# Patient Record
Sex: Female | Born: 1950 | Hispanic: Yes | Marital: Married | Smoking: Never smoker
Health system: Southern US, Community
[De-identification: ages and names within clinical notes are randomized; demographics above are authoritative.]

## PROBLEM LIST (undated history)

## (undated) HISTORY — PX: OTHER SURGICAL HISTORY: SHX169

## (undated) HISTORY — PX: CHOLECYSTECTOMY: SHX55

---

## 2015-08-30 ENCOUNTER — Encounter (HOSPITAL_BASED_OUTPATIENT_CLINIC_OR_DEPARTMENT_OTHER): Payer: Self-pay | Admitting: Emergency Medicine

## 2015-08-30 ENCOUNTER — Emergency Department (HOSPITAL_BASED_OUTPATIENT_CLINIC_OR_DEPARTMENT_OTHER): Payer: Self-pay

## 2015-08-30 ENCOUNTER — Emergency Department (HOSPITAL_BASED_OUTPATIENT_CLINIC_OR_DEPARTMENT_OTHER)
Admission: EM | Admit: 2015-08-30 | Discharge: 2015-08-30 | Disposition: A | Discharge status: Other Acute Inpatient Hospital | Payer: Self-pay | Attending: Emergency Medicine | Admitting: Emergency Medicine

## 2015-08-30 DIAGNOSIS — K859 Acute pancreatitis without necrosis or infection, unspecified: Secondary | ICD-10-CM | POA: Insufficient documentation

## 2015-08-30 DIAGNOSIS — R1012 Left upper quadrant pain: Secondary | ICD-10-CM

## 2015-08-30 LAB — CBC WITH DIFFERENTIAL/PLATELET
BASOS PCT: 1 %
Basophils Absolute: 0.1 10*3/uL (ref 0.0–0.1)
EOS ABS: 0 10*3/uL (ref 0.0–0.7)
EOS PCT: 0 %
HCT: 42.3 % (ref 36.0–46.0)
HEMOGLOBIN: 14.3 g/dL (ref 12.0–15.0)
LYMPHS ABS: 1.6 10*3/uL (ref 0.7–4.0)
Lymphocytes Relative: 15 %
MCH: 32.3 pg (ref 26.0–34.0)
MCHC: 33.8 g/dL (ref 30.0–36.0)
MCV: 95.5 fL (ref 78.0–100.0)
MONO ABS: 0.3 10*3/uL (ref 0.1–1.0)
MONOS PCT: 3 %
NEUTROS PCT: 81 %
Neutro Abs: 8.5 10*3/uL — ABNORMAL HIGH (ref 1.7–7.7)
Platelets: 250 10*3/uL (ref 150–400)
RBC: 4.43 MIL/uL (ref 3.87–5.11)
RDW: 12.1 % (ref 11.5–15.5)
WBC: 10.5 10*3/uL (ref 4.0–10.5)

## 2015-08-30 LAB — URINALYSIS, ROUTINE W REFLEX MICROSCOPIC
BILIRUBIN URINE: NEGATIVE
Glucose, UA: 100 mg/dL — AB
HGB URINE DIPSTICK: NEGATIVE
KETONES UR: NEGATIVE mg/dL
Leukocytes, UA: NEGATIVE
NITRITE: NEGATIVE
Protein, ur: NEGATIVE mg/dL
SPECIFIC GRAVITY, URINE: 1.045 — AB (ref 1.005–1.030)
pH: 7.5 (ref 5.0–8.0)

## 2015-08-30 LAB — COMPREHENSIVE METABOLIC PANEL
ALBUMIN: 4.5 g/dL (ref 3.5–5.0)
ALK PHOS: 77 U/L (ref 38–126)
ALT: 27 U/L (ref 14–54)
ANION GAP: 9 (ref 5–15)
AST: 34 U/L (ref 15–41)
BILIRUBIN TOTAL: 0.5 mg/dL (ref 0.3–1.2)
BUN: 21 mg/dL — AB (ref 6–20)
CALCIUM: 9.5 mg/dL (ref 8.9–10.3)
CO2: 26 mmol/L (ref 22–32)
CREATININE: 0.86 mg/dL (ref 0.44–1.00)
Chloride: 104 mmol/L (ref 101–111)
GFR calc Af Amer: >60 mL/min (ref >60)
GFR calc non Af Amer: >60 mL/min (ref >60)
GLUCOSE: 192 mg/dL — AB (ref 65–99)
Potassium: 4.1 mmol/L (ref 3.5–5.1)
SODIUM: 139 mmol/L (ref 135–145)
TOTAL PROTEIN: 7.7 g/dL (ref 6.5–8.1)

## 2015-08-30 LAB — LIPASE, BLOOD: Lipase: 68 U/L — ABNORMAL HIGH (ref 11–51)

## 2015-08-30 LAB — I-STAT CG4 LACTIC ACID, ED: LACTIC ACID, VENOUS: 2.02 mmol/L — AB (ref 0.5–2.0)

## 2015-08-30 MED ORDER — MORPHINE SULFATE (PF) 4 MG/ML IV SOLN
4.0000 mg | Freq: Once | INTRAVENOUS | Status: AC
  Administered 2015-08-30: 4 mg via INTRAVENOUS
  Filled 2015-08-30: qty 1

## 2015-08-30 MED ORDER — METOCLOPRAMIDE HCL 5 MG/ML IJ SOLN
10.0000 mg | Freq: Once | INTRAMUSCULAR | Status: AC
  Administered 2015-08-30: 10 mg via INTRAVENOUS
  Filled 2015-08-30: qty 2

## 2015-08-30 MED ORDER — ONDANSETRON HCL 4 MG/2ML IJ SOLN
4.0000 mg | Freq: Once | INTRAMUSCULAR | Status: AC
  Administered 2015-08-30: 4 mg via INTRAVENOUS
  Filled 2015-08-30: qty 2

## 2015-08-30 MED ORDER — SODIUM CHLORIDE 0.9 % IV SOLN
INTRAVENOUS | Status: DC
  Administered 2015-08-30: 07:00:00 via INTRAVENOUS

## 2015-08-30 MED ORDER — IOHEXOL 300 MG/ML  SOLN
100.0000 mL | Freq: Once | INTRAMUSCULAR | Status: AC | PRN
  Administered 2015-08-30: 100 mL via INTRAVENOUS

## 2015-08-30 MED ORDER — HYDROMORPHONE HCL 1 MG/ML IJ SOLN
1.0000 mg | Freq: Once | INTRAMUSCULAR | Status: AC
  Administered 2015-08-30: 1 mg via INTRAVENOUS
  Filled 2015-08-30: qty 1

## 2015-08-30 MED ORDER — FENTANYL CITRATE (PF) 100 MCG/2ML IJ SOLN
100.0000 ug | Freq: Once | INTRAMUSCULAR | Status: AC
  Administered 2015-08-30: 100 ug via INTRAVENOUS
  Filled 2015-08-30: qty 2

## 2015-08-30 MED ORDER — GI COCKTAIL ~~LOC~~
30.0000 mL | Freq: Once | ORAL | Status: AC
  Administered 2015-08-30: 30 mL via ORAL
  Filled 2015-08-30: qty 30

## 2015-08-30 MED ORDER — DIPHENHYDRAMINE HCL 50 MG/ML IJ SOLN
25.0000 mg | Freq: Once | INTRAMUSCULAR | Status: AC
  Administered 2015-08-30: 25 mg via INTRAVENOUS
  Filled 2015-08-30: qty 1

## 2015-08-30 MED ORDER — IOHEXOL 300 MG/ML  SOLN
25.0000 mL | Freq: Once | INTRAMUSCULAR | Status: AC | PRN
  Administered 2015-08-30: 25 mL via ORAL

## 2015-08-30 NOTE — ED Notes (Signed)
Report given to Ishmael HolterBrian RN on [facility]

## 2015-08-30 NOTE — ED Notes (Signed)
Warm pack given per request of pt by Heather.

## 2015-08-30 NOTE — ED Notes (Signed)
PT in CT. Daughter in room requested water for herself, given.

## 2015-08-30 NOTE — ED Notes (Signed)
[facility].

## 2015-08-30 NOTE — ED Notes (Signed)
Pt back from CT and ambulated to BR with daughter.

## 2015-08-30 NOTE — ED Provider Notes (Signed)
  Physical Exam  BP 153/79 mmHg  Pulse 78  Resp 16  Wt 163 lb (73.936 kg)  SpO2 98%  Physical Exam  Abdominal: There is no tenderness. There is no rebound and no guarding.    ED Course  Procedures  MDM Received patient in turnover from Dr. Rolley SimsMolphus, briefly patient awoke with sever LUQ abdominal pain with radiation to the back.  Concern for pancreatitis CBC CMP lipase CT scan were ordered. Lipase is mildly elevated at 68. Labs otherwise unremarkable. CT scan of the abdomen and pelvis read as completely negative.  Patient reassessed feeling better. No continued abdominal pain on exam. Will give an oral trial. Nauseated post GI cocktail.     Patient is unable to tolerate by mouth on the ED. Patient given 3 rounds of pain medicine and nausea medicine without any relief. Discussed with patient would like to be admitted at [facility].  The patients results and plan were reviewed and discussed.   Any x-rays performed were independently reviewed by myself.   Differential diagnosis were considered with the presenting HPI.  Medications  0.9 %  sodium chloride infusion ( Intravenous New Bag/Given 08/30/15 0651)  ondansetron (ZOFRAN) injection 4 mg (4 mg Intravenous Given 08/30/15 0651)  fentaNYL (SUBLIMAZE) injection 100 mcg (100 mcg Intravenous Given 08/30/15 0651)  iohexol (OMNIPAQUE) 300 MG/ML solution 25 mL (25 mLs Oral Contrast Given 08/30/15 0635)  gi cocktail (Maalox,Lidocaine,Donnatal) (30 mLs Oral Given 08/30/15 0747)  iohexol (OMNIPAQUE) 300 MG/ML solution 100 mL (100 mLs Intravenous Contrast Given 08/30/15 0718)  ondansetron (ZOFRAN) injection 4 mg (4 mg Intravenous Given 08/30/15 0808)  morphine 4 MG/ML injection 4 mg (4 mg Intravenous Given 08/30/15 0835)  HYDROmorphone (DILAUDID) injection 1 mg (1 mg Intravenous Given 08/30/15 1148)  metoCLOPramide (REGLAN) injection 10 mg (10 mg Intravenous Given 08/30/15 1143)  diphenhydrAMINE (BENADRYL) injection 25 mg (25 mg  Intravenous Given 08/30/15 1141)    Filed Vitals:   08/30/15 1000 08/30/15 1100 08/30/15 1200 08/30/15 1300  BP: 151/79 147/69 137/75 138/79  Pulse: 66 68 77 72  Temp:      TempSrc:      Resp: 16 17 14 19   Weight:      SpO2: 96% 97% 95% 95%    Final diagnoses:  Left upper quadrant pain  Acute pancreatitis, unspecified pancreatitis type    Admission/ observation were discussed with the admitting physician, patient and/or family and they are comfortable with the plan.    Melene Planan Caralina Nop, DO 08/30/15 1340

## 2015-08-30 NOTE — ED Notes (Signed)
Micheal from [facility] given report and [facility]

## 2015-08-30 NOTE — ED Notes (Signed)
2nd lactic acid deferred per Dr. Adela LankFloyd.

## 2015-08-30 NOTE — ED Notes (Addendum)
[facility] per nursing supervisor Mill[place]ita, rn aware, HP-1 to transport

## 2015-08-30 NOTE — ED Notes (Signed)
Pt given crackers.  

## 2015-08-30 NOTE — ED Notes (Signed)
Pt ambulated to BR. C/o pain. Pt escorted back to room via w/c.

## 2015-08-30 NOTE — ED Notes (Signed)
PT vomited large amt of partially digested crackers and contrast.

## 2015-08-30 NOTE — ED Notes (Addendum)
Pt reports awaken from sleep with severe abd pain , she denies any event or hx of trauma or injury. Admits to 3 emesis and 1 diarrhea stools since onset and has had mid back pain

## 2015-08-30 NOTE — ED Notes (Signed)
PT given sprite to drink for po challenge per her request.

## 2015-08-30 NOTE — ED Provider Notes (Signed)
CSN: [id number]     Arrival date & time 08/30/15  [id number] History   First MD Initiated Contact with Patient 08/30/15 [phone]     Chief Complaint  Patient presents with  . Abdominal Pain     (Consider location/radiation/quality/duration/timing/severity/associated sxs/prior Treatment) HPI  This is a 64 year old female without significant past medical history except for a necrotic abdominal mass several years ago that did not require surgical intervention.  She is here with severe left epigastric pain that awakened her from sleep about 2 AM. The pain is rated a 9 out of 10 and is characterized as having cramping and sharp components. It radiates into her back. Pain is worse with movement and certain positions. She has vomited 3 times and had one episode of diarrhea. She has no history of similar pain. She has no history of aortic aneurysm. She is not a heavy drinker.  History reviewed. No pertinent past medical history. Past Surgical History  Procedure Laterality Date  . Necrotic abd mass    . Cesarean section     History reviewed. No pertinent family history. Social History  Substance Use Topics  . Smoking status: Never Smoker   . Smokeless tobacco: Never Used  . Alcohol Use: Yes   OB History    No data available     Review of Systems  All other systems reviewed and are negative.   Allergies  Review of patient's allergies indicates no known allergies.  Home Medications   Prior to Admission medications   Medication Sig Start Date End Date Taking? Authorizing Provider  ibuprofen (ADVIL,MOTRIN) 600 MG tablet Take 600 mg by mouth every 6 (six) hours as needed.   Yes Historical Provider, MD   BP 138/79 mmHg  Pulse 72  Temp(Src) 98.1 F (36.7 C) (Oral)  Resp 19  Wt 163 lb (73.936 kg)  SpO2 95%   Physical Exam  General: Well-developed, well-nourished female in obvious discomfort; appearance consistent with age of record HENT: normocephalic; atraumatic Eyes: pupils equal,  round and reactive to light; extraocular muscles intact Neck: supple Heart: regular rate and rhythm Lungs: clear to auscultation bilaterally Abdomen: soft; nondistended; left epigastric tenderness; no masses or hepatosplenomegaly; bowel sounds present Extremities: No deformity; full range of motion; pulses normal Neurologic: Awake, alert and oriented; motor function intact in all extremities and symmetric; no facial droop Skin: Warm and dry Psychiatric: Agitated    ED Course  Procedures (including critical care time)   MDM  Nursing notes and vitals signs, including pulse oximetry, reviewed.  Summary of this visit's results, reviewed by myself:  Labs:  Results for orders placed or performed during the hospital encounter of 08/30/15 (from the past 24 hour(s))  Comprehensive metabolic panel     Status: Abnormal   Collection Time: 08/30/15  6:45 AM  Result Value Ref Range   Sodium 139 135 - 145 mmol/L   Potassium 4.1 3.5 - 5.1 mmol/L   Chloride 104 101 - 111 mmol/L   CO2 26 22 - 32 mmol/L   Glucose, Bld 192 (H) 65 - 99 mg/dL   BUN 21 (H) 6 - 20 mg/dL   Creatinine, Ser 4.780.86 0.44 - 1.00 mg/dL   Calcium 9.5 8.9 - 29.510.3 mg/dL   Total Protein 7.7 6.5 - 8.1 g/dL   Albumin 4.5 3.5 - 5.0 g/dL   AST 34 15 - 41 U/L   ALT 27 14 - 54 U/L   Alkaline Phosphatase 77 38 - 126 U/L   Total Bilirubin 0.5 0.3 -  1.2 mg/dL   GFR calc non Af Amer >60 >60 mL/min   GFR calc Af Amer >60 >60 mL/min   Anion gap 9 5 - 15  Lipase, blood     Status: Abnormal   Collection Time: 08/30/15  6:45 AM  Result Value Ref Range   Lipase 68 (H) 11 - 51 U/L  CBC with Differential/Platelet     Status: Abnormal   Collection Time: 08/30/15  6:45 AM  Result Value Ref Range   WBC 10.5 4.0 - 10.5 K/uL   RBC 4.43 3.87 - 5.11 MIL/uL   Hemoglobin 14.3 12.0 - 15.0 g/dL   HCT 16.1 09.6 - 04.5 %   MCV 95.5 78.0 - 100.0 fL   MCH 32.3 26.0 - 34.0 pg   MCHC 33.8 30.0 - 36.0 g/dL   RDW 40.9 81.1 - 91.4 %   Platelets 250  150 - 400 K/uL   Neutrophils Relative % 81 %   Neutro Abs 8.5 (H) 1.7 - 7.7 K/uL   Lymphocytes Relative 15 %   Lymphs Abs 1.6 0.7 - 4.0 K/uL   Monocytes Relative 3 %   Monocytes Absolute 0.3 0.1 - 1.0 K/uL   Eosinophils Relative 0 %   Eosinophils Absolute 0.0 0.0 - 0.7 K/uL   Basophils Relative 1 %   Basophils Absolute 0.1 0.0 - 0.1 K/uL  I-Stat CG4 Lactic Acid, ED     Status: Abnormal   Collection Time: 08/30/15  6:48 AM  Result Value Ref Range   Lactic Acid, Venous 2.02 (HH) 0.5 - 2.0 mmol/L   Comment NOTIFIED PHYSICIAN   Urinalysis, Routine w reflex microscopic (not at [facility])     Status: Abnormal   Collection Time: 08/30/15  7:25 AM  Result Value Ref Range   Color, Urine YELLOW YELLOW   APPearance CLEAR CLEAR   Specific Gravity, Urine 1.045 (H) 1.005 - 1.030   pH 7.5 5.0 - 8.0   Glucose, UA 100 (A) NEGATIVE mg/dL   Hgb urine dipstick NEGATIVE NEGATIVE   Bilirubin Urine NEGATIVE NEGATIVE   Ketones, ur NEGATIVE NEGATIVE mg/dL   Protein, ur NEGATIVE NEGATIVE mg/dL   Nitrite NEGATIVE NEGATIVE   Leukocytes, UA NEGATIVE NEGATIVE    Imaging Studies: Ct Abdomen Pelvis W Contrast  08/30/2015  CLINICAL DATA:  Upper abdominal pain with nausea and vomiting. EXAM: CT ABDOMEN AND PELVIS WITH CONTRAST TECHNIQUE: Multidetector CT imaging of the abdomen and pelvis was performed using the standard protocol following bolus administration of intravenous contrast. CONTRAST:  OMNIPAQUE IOHEXOL 300 MG/ML SOLN, 25mL OMNIPAQUE IOHEXOL 300 MG/ML SOLN COMPARISON:  None. FINDINGS: Lower chest:  Normal. Hepatobiliary: Normal. Pancreas: Normal. Spleen: Normal. Adrenals/Urinary Tract: Normal. Stomach/Bowel: Normal including the terminal ileum and appendix. Vascular/Lymphatic: Minimal atherosclerotic disease of the abdominal aorta. Reproductive: 3.5 cm fibroid in the uterus.  Normal ovaries. Other: No free air or free fluid. Musculoskeletal: Degenerative disc disease at L5-S1. 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 No acute abnormalities. IMPRESSION: Benign appearing abdomen and chest. Electronically Signed   By: Francene Boyers M.D.   On: 08/30/2015 07:45    6:54 AM Pain and nausea controlled with IV medications. Laboratory studies and CT scan pending.     Paula Libra, MD 08/30/15 [phone]

## 2017-03-29 ENCOUNTER — Ambulatory Visit: Payer: Self-pay | Admitting: Allergy and Immunology

## 2017-04-05 IMAGING — CT CT ABD-PELV W/ CM
2 of 5 series · 17 of 46 positions shown, 19 images · IV contrast (omnipaque)
Comparison: None.

CLINICAL DATA: Upper abdominal pain with nausea and vomiting.

EXAM:
CT ABDOMEN AND PELVIS WITH CONTRAST
TECHNIQUE: Multidetector CT imaging of the abdomen and pelvis was performed
using the standard protocol following bolus administration of
intravenous contrast.
CONTRAST:  100mL OMNIPAQUE IOHEXOL 300 MG/ML SOLN, 25mL OMNIPAQUE
IOHEXOL 300 MG/ML SOLN

[Series 2: abd/pelvis 5.0 b31f · axial · 0.78mm/px · z∈[+743,+1193]mm · 14 of 100 slices shown, 16 images]
[im 5/100  soft-tissue]
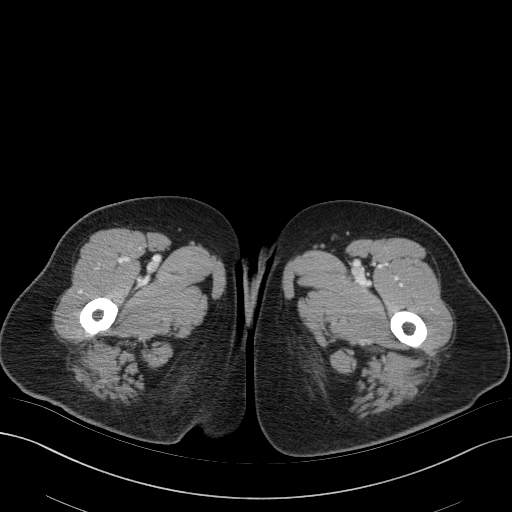
[im 5/100  bone]
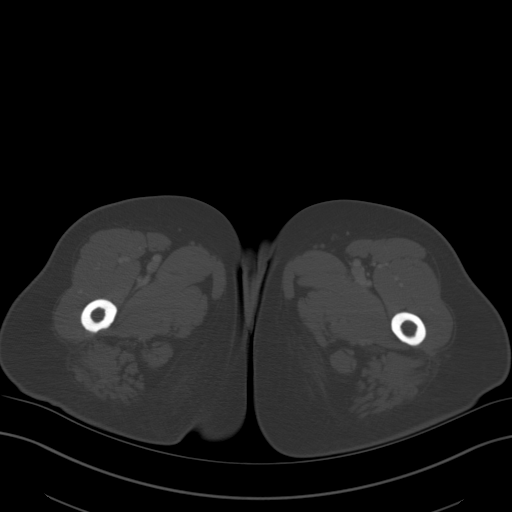
[im 15/100  soft-tissue]
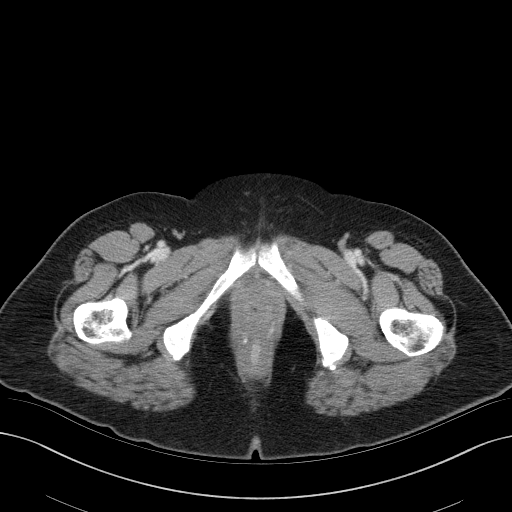
[im 20/100  soft-tissue]
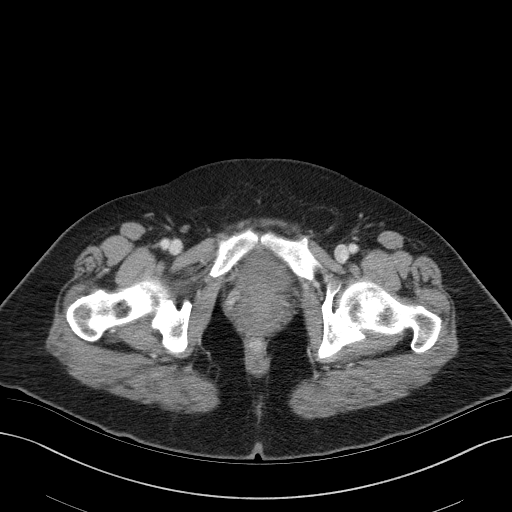
[im 25/100  soft-tissue]
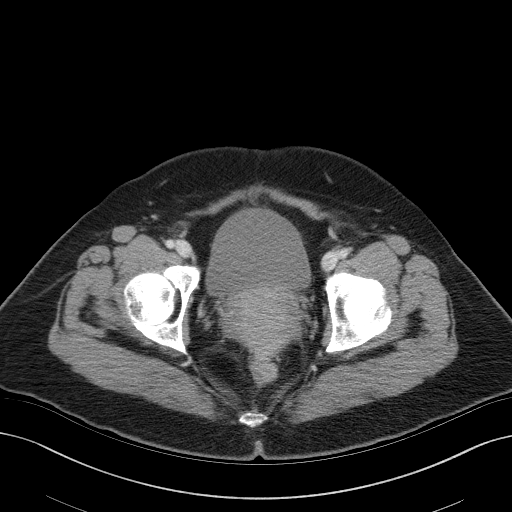
[im 35/100  soft-tissue]
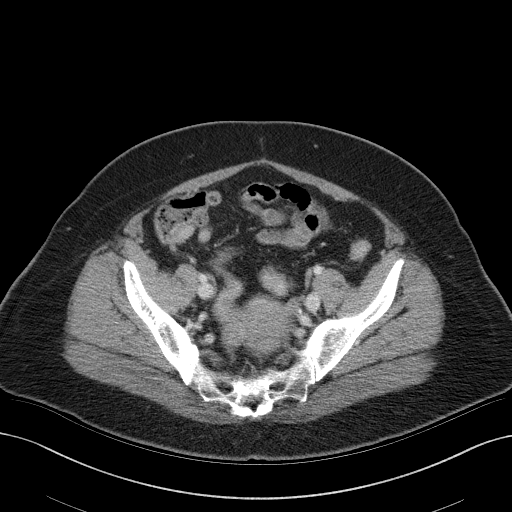
[im 40/100  soft-tissue]
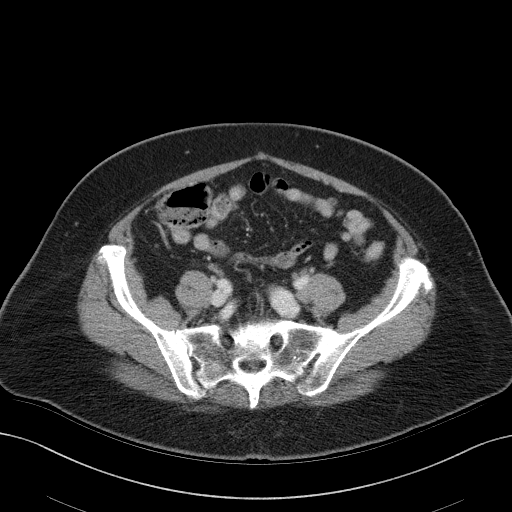
[im 45/100  soft-tissue]
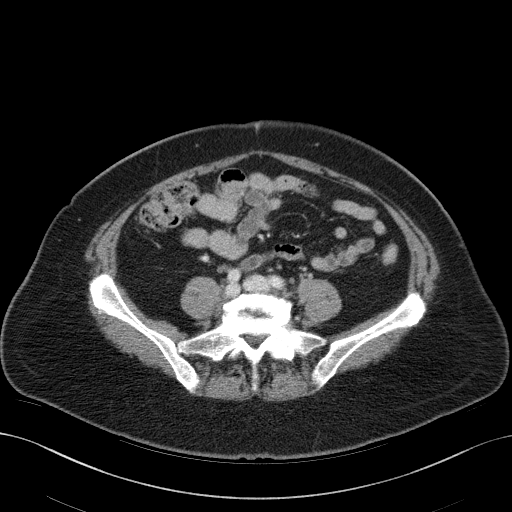
[im 55/100  soft-tissue]
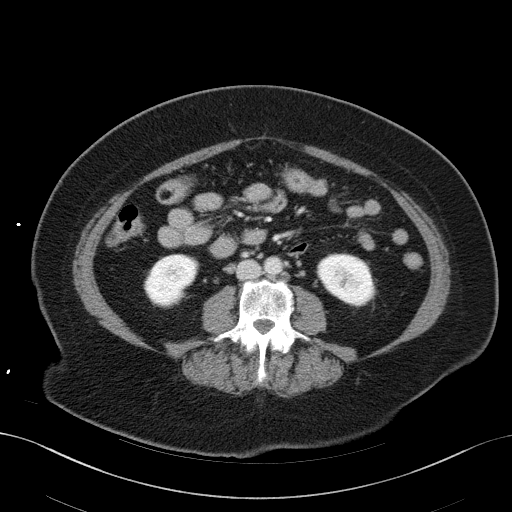
[im 60/100  soft-tissue]
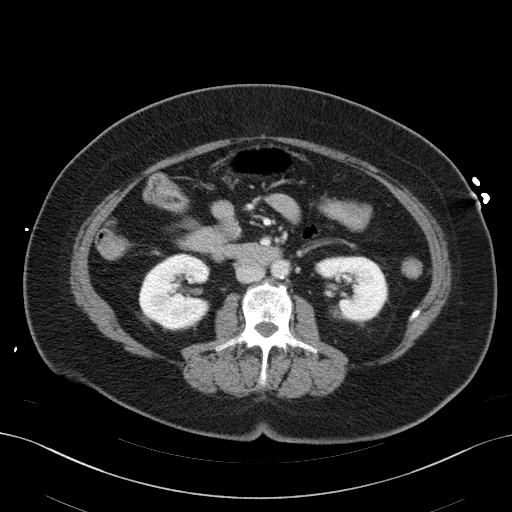
[im 60/100  bone]
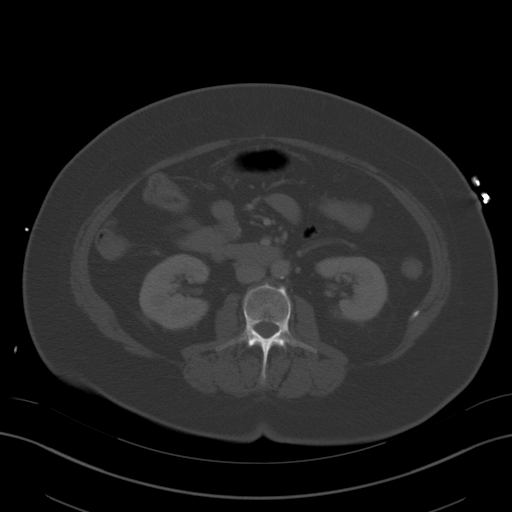
[im 65/100  soft-tissue]
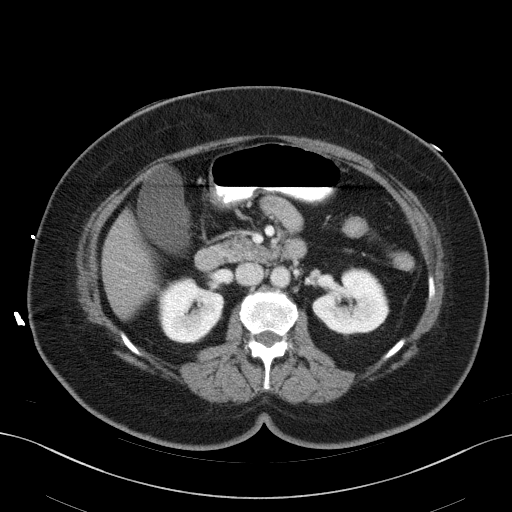
[im 75/100  soft-tissue]
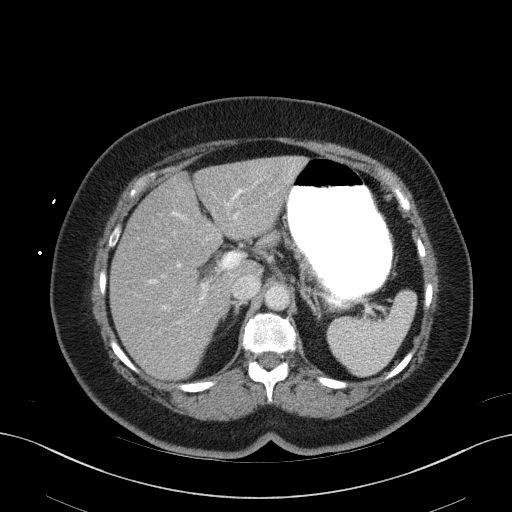
[im 80/100  soft-tissue]
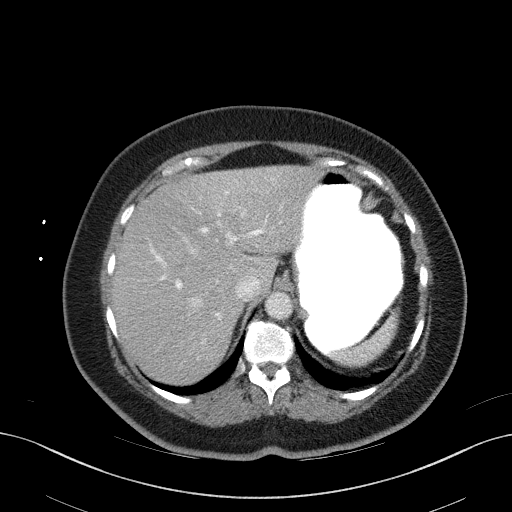
[im 85/100  soft-tissue]
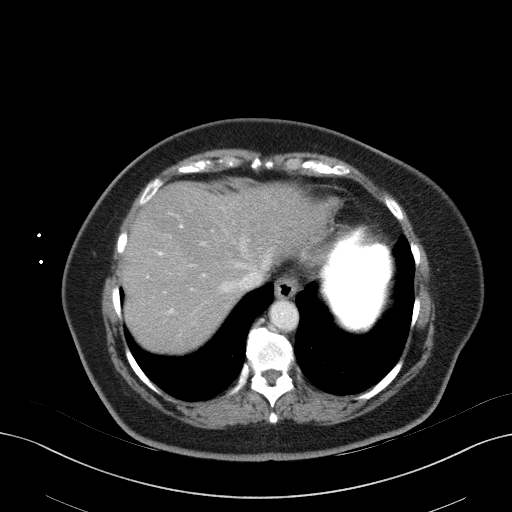
[im 95/100  soft-tissue]
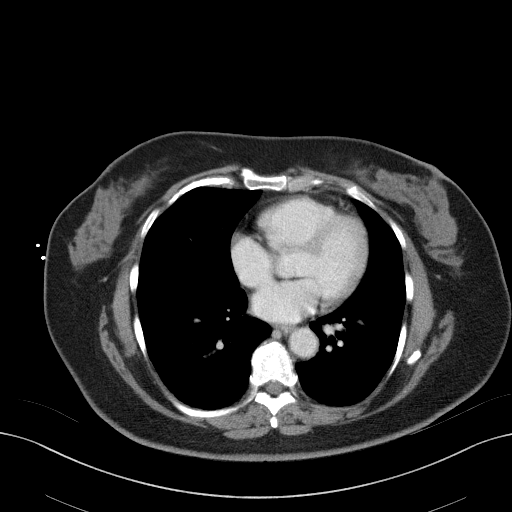

[Series 5: abd/pelvis 3.0 coronal · coronal · 0.81mm/px · 3 of 98 slices shown]
[im 33/98  soft-tissue]
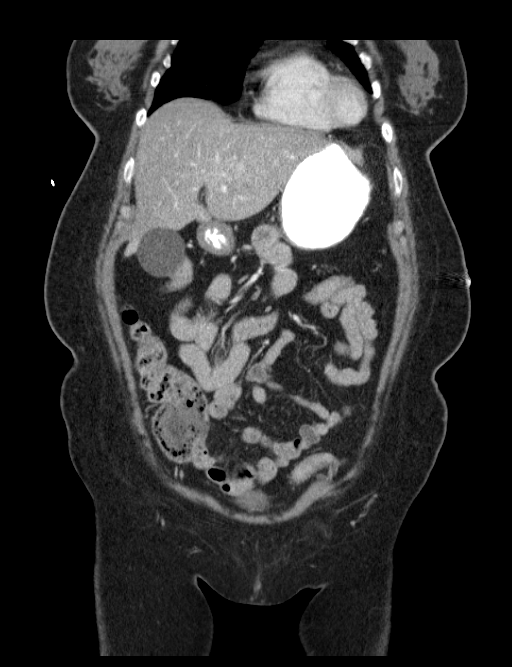
[im 44/98  soft-tissue]
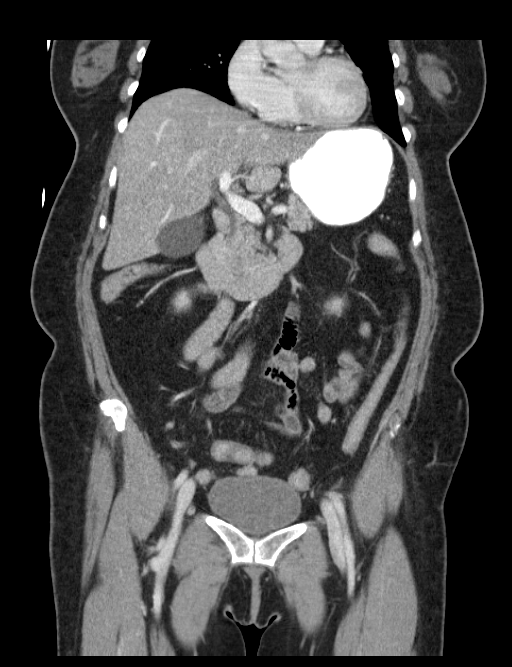
[im 54/98  soft-tissue]
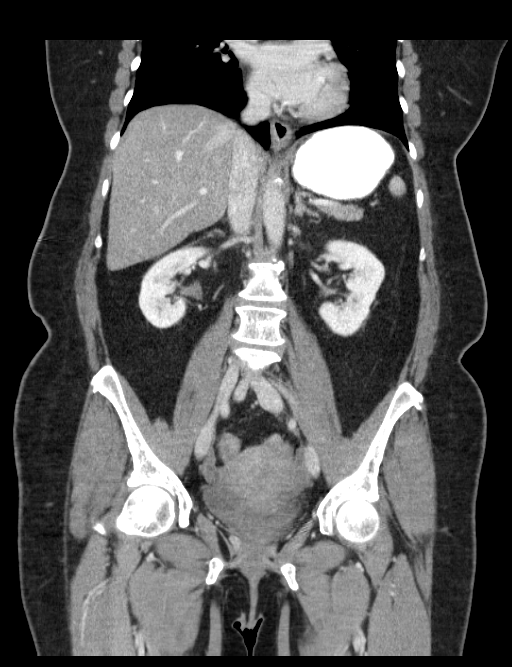

[17 of 46 positions shown; findings below may reference images not displayed]

FINDINGS: Lower chest:  Normal.

Hepatobiliary: Normal.

Pancreas: Normal.

Spleen: Normal.

Adrenals/Urinary Tract: Normal.

Stomach/Bowel: Normal including the terminal ileum and appendix.

Vascular/Lymphatic: Minimal atherosclerotic disease of the abdominal
aorta.

Reproductive: 3.5 cm fibroid in the uterus.  Normal ovaries.

Other: No free air or free fluid.

Musculoskeletal: Degenerative disc disease at L5-S1. Degenerative
facet arthritis at L4-5. No acute abnormalities.
IMPRESSION: Benign appearing abdomen and chest.

## 2022-11-02 ENCOUNTER — Emergency Department (HOSPITAL_COMMUNITY): Payer: 59

## 2022-11-02 ENCOUNTER — Encounter (HOSPITAL_BASED_OUTPATIENT_CLINIC_OR_DEPARTMENT_OTHER): Payer: Self-pay | Admitting: Emergency Medicine

## 2022-11-02 ENCOUNTER — Other Ambulatory Visit: Payer: Self-pay

## 2022-11-02 ENCOUNTER — Emergency Department (HOSPITAL_BASED_OUTPATIENT_CLINIC_OR_DEPARTMENT_OTHER)
Admission: EM | Admit: 2022-11-02 | Discharge: 2022-11-03 | Disposition: A | Discharge status: Home / Self Care | Payer: 59 | Attending: Emergency Medicine | Admitting: Emergency Medicine

## 2022-11-02 ENCOUNTER — Emergency Department (HOSPITAL_BASED_OUTPATIENT_CLINIC_OR_DEPARTMENT_OTHER): Payer: 59

## 2022-11-02 DIAGNOSIS — R519 Headache, unspecified: Secondary | ICD-10-CM

## 2022-11-02 DIAGNOSIS — U071 COVID-19: Secondary | ICD-10-CM | POA: Diagnosis not present

## 2022-11-02 DIAGNOSIS — R2990 Unspecified symptoms and signs involving the nervous system: Secondary | ICD-10-CM

## 2022-11-02 LAB — CBC WITH DIFFERENTIAL/PLATELET
Abs Immature Granulocytes: 0.03 10*3/uL (ref 0.00–0.07)
Basophils Absolute: 0 10*3/uL (ref 0.0–0.1)
Basophils Relative: 0 %
Eosinophils Absolute: 0.1 10*3/uL (ref 0.0–0.5)
Eosinophils Relative: 1 %
HCT: 42.6 % (ref 36.0–46.0)
Hemoglobin: 14.7 g/dL (ref 12.0–15.0)
Immature Granulocytes: 0 %
Lymphocytes Relative: 38 %
Lymphs Abs: 3.1 10*3/uL (ref 0.7–4.0)
MCH: 33 pg (ref 26.0–34.0)
MCHC: 34.5 g/dL (ref 30.0–36.0)
MCV: 95.5 fL (ref 80.0–100.0)
Monocytes Absolute: 0.6 10*3/uL (ref 0.1–1.0)
Monocytes Relative: 7 %
Neutro Abs: 4.4 10*3/uL (ref 1.7–7.7)
Neutrophils Relative %: 54 %
Platelets: 234 10*3/uL (ref 150–400)
RBC: 4.46 MIL/uL (ref 3.87–5.11)
RDW: 12.1 % (ref 11.5–15.5)
WBC: 8.3 10*3/uL (ref 4.0–10.5)
nRBC: 0 % (ref 0.0–0.2)

## 2022-11-02 LAB — BASIC METABOLIC PANEL
Anion gap: 8 (ref 5–15)
BUN: 14 mg/dL (ref 8–23)
CO2: 28 mmol/L (ref 22–32)
Calcium: 10.4 mg/dL — ABNORMAL HIGH (ref 8.9–10.3)
Chloride: 103 mmol/L (ref 98–111)
Creatinine, Ser: 0.77 mg/dL (ref 0.44–1.00)
GFR, Estimated: >60 mL/min (ref >60)
Glucose, Bld: 103 mg/dL — ABNORMAL HIGH (ref 70–99)
Potassium: 4.5 mmol/L (ref 3.5–5.1)
Sodium: 139 mmol/L (ref 135–145)

## 2022-11-02 MED ORDER — ACETAMINOPHEN 500 MG PO TABS
1000.0000 mg | ORAL_TABLET | Freq: Once | ORAL | Status: AC
  Administered 2022-11-02: 1000 mg via ORAL
  Filled 2022-11-02: qty 2

## 2022-11-02 MED ORDER — IOHEXOL 350 MG/ML SOLN
75.0000 mL | Freq: Once | INTRAVENOUS | Status: AC | PRN
  Administered 2022-11-02: 75 mL via INTRAVENOUS

## 2022-11-02 NOTE — ED Notes (Signed)
Dr Ronnald Nian states patient can go to [facility] by POV accepted by Dr. Laverta Baltimore for MRI.  Handoff report given to Rennie Natter at [facility].

## 2022-11-02 NOTE — ED Provider Notes (Signed)
[facility] Provider Note   CSN: [id number] Arrival date & time: 11/02/22  1551     History  Chief Complaint  Patient presents with   Headache   Covid Positive   Numbness    Janice Schmidt is a 72 y.o. female.  Patient here with right arm numbness and weakness.  Symptoms have been on and off for the last 2 to 3 days.  First noticed it on Saturday.  Feels some difficulty when she is trying to pick things up at times.  Feels tingling in her arm/numbness.  She was diagnosed with COVID last week and took antivirals.  She not have any chest pain or shortness of breath.  Still having some cough and headaches.  Blood pressure has been high.  History of high blood pressure.  Denies any vision changes, slurred speech, leg weakness.  The history is provided by the patient.       Home Medications Prior to Admission medications   Medication Sig Start Date End Date Taking? Authorizing Provider  ibuprofen (ADVIL,MOTRIN) 600 MG tablet Take 600 mg by mouth every 6 (six) hours as needed.    [provider]      Allergies    Patient has no known allergies.    Review of Systems   Review of Systems  Physical Exam Updated Vital Signs BP (!) 176/76 (BP Location: Right Arm)   Pulse 74   Temp 98.9 F (37.2 C)   Resp 18   SpO2 100%  Physical Exam Vitals and nursing note reviewed.  Constitutional:      General: She is not in acute distress.    Appearance: She is well-developed. She is not ill-appearing.  HENT:     Head: Normocephalic and atraumatic.     Mouth/Throat:     Mouth: Mucous membranes are moist.  Eyes:     Extraocular Movements: Extraocular movements intact.     Conjunctiva/sclera: Conjunctivae normal.     Pupils: Pupils are equal, round, and reactive to light.  Cardiovascular:     Rate and Rhythm: Normal rate and regular rhythm.     Heart sounds: No murmur heard. Pulmonary:     Effort: Pulmonary effort is normal. No  respiratory distress.     Breath sounds: Normal breath sounds.  Abdominal:     Palpations: Abdomen is soft.     Tenderness: There is no abdominal tenderness.  Musculoskeletal:        General: No swelling. Normal range of motion.     Cervical back: Normal range of motion and neck supple.  Skin:    General: Skin is warm and dry.     Capillary Refill: Capillary refill takes less than 2 seconds.  Neurological:     Mental Status: She is alert.     Comments: 5+ out of 5 strength throughout, normal sensation, no drift, normal finger-nose-finger, normal speech, normal visual fields, no drift  Psychiatric:        Mood and Affect: Mood normal.     ED Results / Procedures / Treatments   Labs (all labs ordered are listed, but only abnormal results are displayed) Labs Reviewed  BASIC METABOLIC PANEL - Abnormal; Notable for the following components:      Result Value   Glucose, Bld 103 (*)    Calcium 10.4 (*)    All other components within normal limits  CBC WITH DIFFERENTIAL/PLATELET    EKG EKG Interpretation  Date/Time:  Monday November 02 2022  16:21:47 EST Ventricular Rate:  69 PR Interval:  134 QRS Duration: 84 QT Interval:  404 QTC Calculation: 432 R Axis:   3 Text Interpretation: Normal sinus rhythm Minimal voltage criteria for LVH, may be normal variant ( R in aVL ) Borderline ECG No previous ECGs available Confirmed by Virgina Norfolk ([id number]) on 11/02/2022 5:00:26 PM  Radiology CT VENOGRAM HEAD  Result Date: 11/02/2022 CLINICAL DATA:  Dural venous sinus thrombosis suspected EXAM: CT VENOGRAM HEAD TECHNIQUE: Venographic phase images of the brain were obtained following the administration of intravenous contrast. Multiplanar reformats and maximum intensity projections were generated. RADIATION DOSE REDUCTION: This exam was performed according to the departmental dose-optimization program which includes automated exposure control, adjustment of the mA and/or kV according to patient  size and/or use of iterative reconstruction technique. CONTRAST:  88mL OMNIPAQUE IOHEXOL 350 MG/ML SOLN COMPARISON:  None Available. 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 Otherwise, no evidence of acute large vascular territory infarct. Patchy white matter hypodensities, nonspecific but compatible with chronic microvascular disease. No evidence of acute hemorrhage, mass lesion, midline shift or hydrocephalus. On the CT venogram: No evidence of dural venous sinus thrombosis. The superior sagittal, transverse, sigmoid, and straight sinuses are patent. The visualized deep cerebral veins are patent. Small rounded probable arachnoid granulations are noted within the anterior aspect of the straight sinus and the distal left transverse sinus. Symmetric opacification of the cavernous sinuses. IMPRESSION: 1. 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 MRI could provide more sensitive evaluation for acute infarct if clinically warranted. 2. No evidence of dural venous sinus thrombosis. Electronically Signed   By: Feliberto Harts M.D.   On: 11/02/2022 17:50    Procedures Procedures    Medications Ordered in ED Medications  acetaminophen (TYLENOL) tablet 1,000 mg (has no administration in time range)  iohexol (OMNIPAQUE) 350 MG/ML injection 75 mL (75 mLs Intravenous Contrast Given 11/02/22 1735)    ED Course/ Medical Decision Making/ A&P                             Medical Decision Making Amount and/or Complexity of Data Reviewed Labs: ordered. Radiology: ordered.  Risk OTC drugs. Prescription drug management.   Janice Schmidt is here with strokelike symptoms with headache, right arm numbness on and off over the last 2 to 3 days.  Blood pressure has been elevated.  Not having any specific symptoms now except for mild headache.  Denies numbness or weakness currently.  No vision changes.  She has noticed some poor grip  in the right hand at times last several days.  She has been diagnosed with COVID about a week ago.  She finished antivirals.  Still with a cough but no shortness of breath or chest pain.  She is well-appearing.  Neurologically she appears to be grossly intact.  Basic labs including CBC and BMP were ordered as well as CT venogram.  Differential diagnosis is stroke versus head bleed versus thrombosis versus COVID symptoms.  Will give her Tylenol.  Per my review and interpretation of labs no significant anemia or electrolyte abnormality or kidney injury.  CT scan shows may be small age-indeterminate infarct in the left basal ganglia.  Given that she is having some right-sided symptoms we will send her to [facility] for MRI.  There is no evidence of dural venous sinus thrombosis.  Patient is stable and they prefer  to go personal vehicle which I think is reasonable.  Dr. Merrily Pew Long and Dr. Francia Greaves made aware.  MRI has been ordered.  She understands that she may need to wait in the waiting room for the study to get done.    This chart was dictated using voice recognition software.  Despite best efforts to proofread,  errors can occur which can change the documentation meaning.         Final Clinical Impression(s) / ED Diagnoses Final diagnoses:  Stroke-like symptoms    Rx / DC Orders ED Discharge Orders     None         Lennice Sites, DO 11/02/22 1844

## 2022-11-02 NOTE — ED Notes (Signed)
Right PIV wrapped and secured for transport.  Daughter  to take patient to [facility]  for MRI.  Left without difficulty

## 2022-11-02 NOTE — ED Triage Notes (Signed)
Covid +, symptoms started 10/28/2021 On paxlovid. C/ HA and right arm numbness on and off that started over 2 days ago. Feels like she has gotten worse. Has notice her BP is elevated

## 2022-11-03 NOTE — ED Provider Notes (Signed)
Patient received in transfer for MRI.  Had headache and some right-sided tingling.  CT concerning for possible subacute infarct.  MRI was ordered and obtained.  MRI does not show any evidence of acute infarct.  On recheck, patient states that her headache improved significantly with Tylenol.  She no longer has residual right-sided symptoms.  Suspect potential migraine versus symptoms related to recent COVID illness.  Patient directed back to her primary care physician.   Merryl Hacker, MD 11/03/22 [phone]

## 2022-11-03 NOTE — Discharge Instructions (Signed)
You were seen today for headache and right-sided symptoms.  Your MRI does not show any evidence of acute stroke.  Your symptoms have since resolved.  Follow-up closely with your primary care physician.
